# Patient Record
Sex: Female | Born: 1990 | Race: Black or African American | Hispanic: No | Smoking: Never smoker
Health system: Southern US, Community
[De-identification: ages and names within clinical notes are randomized; demographics above are authoritative.]

## PROBLEM LIST (undated history)

## (undated) ENCOUNTER — Inpatient Hospital Stay (HOSPITAL_COMMUNITY): Payer: Medicaid Other

## (undated) DIAGNOSIS — F419 Anxiety disorder, unspecified: Secondary | ICD-10-CM

## (undated) DIAGNOSIS — F32A Depression, unspecified: Secondary | ICD-10-CM

## (undated) HISTORY — DX: Depression, unspecified: F32.A

## (undated) HISTORY — DX: Anxiety disorder, unspecified: F41.9

---

## 2020-05-28 ENCOUNTER — Encounter (HOSPITAL_COMMUNITY): Payer: Self-pay | Admitting: Obstetrics and Gynecology

## 2020-05-28 ENCOUNTER — Inpatient Hospital Stay (HOSPITAL_COMMUNITY): Payer: Medicaid Other

## 2020-05-28 ENCOUNTER — Inpatient Hospital Stay (HOSPITAL_COMMUNITY)
Admission: AD | Admit: 2020-05-28 | Discharge: 2020-05-28 | Disposition: A | Discharge status: Home / Self Care | Payer: Medicaid Other | Attending: Obstetrics and Gynecology | Admitting: Obstetrics and Gynecology

## 2020-05-28 DIAGNOSIS — Z791 Long term (current) use of non-steroidal anti-inflammatories (NSAID): Secondary | ICD-10-CM | POA: Insufficient documentation

## 2020-05-28 DIAGNOSIS — O26891 Other specified pregnancy related conditions, first trimester: Secondary | ICD-10-CM | POA: Diagnosis not present

## 2020-05-28 DIAGNOSIS — Z3A01 Less than 8 weeks gestation of pregnancy: Secondary | ICD-10-CM | POA: Diagnosis not present

## 2020-05-28 DIAGNOSIS — Z79899 Other long term (current) drug therapy: Secondary | ICD-10-CM | POA: Insufficient documentation

## 2020-05-28 DIAGNOSIS — R109 Unspecified abdominal pain: Secondary | ICD-10-CM | POA: Insufficient documentation

## 2020-05-28 DIAGNOSIS — O26851 Spotting complicating pregnancy, first trimester: Secondary | ICD-10-CM

## 2020-05-28 DIAGNOSIS — O3680X0 Pregnancy with inconclusive fetal viability, not applicable or unspecified: Secondary | ICD-10-CM

## 2020-05-28 DIAGNOSIS — O209 Hemorrhage in early pregnancy, unspecified: Secondary | ICD-10-CM | POA: Insufficient documentation

## 2020-05-28 HISTORY — DX: Anxiety disorder, unspecified: F41.9

## 2020-05-28 HISTORY — DX: Depression, unspecified: F32.A

## 2020-05-28 LAB — COMPREHENSIVE METABOLIC PANEL
ALT: 22 U/L (ref 0–44)
AST: 20 U/L (ref 15–41)
Albumin: 3.6 g/dL (ref 3.5–5.0)
Alkaline Phosphatase: 35 U/L — ABNORMAL LOW (ref 38–126)
Anion gap: 10 (ref 5–15)
BUN: 7 mg/dL (ref 6–20)
CO2: 23 mmol/L (ref 22–32)
Calcium: 9.1 mg/dL (ref 8.9–10.3)
Chloride: 105 mmol/L (ref 98–111)
Creatinine, Ser: 0.65 mg/dL (ref 0.44–1.00)
GFR calc Af Amer: >60 mL/min (ref >60)
GFR calc non Af Amer: >60 mL/min (ref >60)
Glucose, Bld: 99 mg/dL (ref 70–99)
Potassium: 3.7 mmol/L (ref 3.5–5.1)
Sodium: 138 mmol/L (ref 135–145)
Total Bilirubin: 0.7 mg/dL (ref 0.3–1.2)
Total Protein: 7.1 g/dL (ref 6.5–8.1)

## 2020-05-28 LAB — URINALYSIS, ROUTINE W REFLEX MICROSCOPIC
Bacteria, UA: NONE SEEN
Bilirubin Urine: NEGATIVE
Glucose, UA: NEGATIVE mg/dL
Ketones, ur: NEGATIVE mg/dL
Leukocytes,Ua: NEGATIVE
Nitrite: NEGATIVE
Protein, ur: 30 mg/dL — AB
RBC / HPF: >50 RBC/hpf — ABNORMAL HIGH (ref 0–5)
Specific Gravity, Urine: 1.021 (ref 1.005–1.030)
pH: 5 (ref 5.0–8.0)

## 2020-05-28 LAB — CBC
HCT: 40.7 % (ref 36.0–46.0)
Hemoglobin: 12.9 g/dL (ref 12.0–15.0)
MCH: 27.7 pg (ref 26.0–34.0)
MCHC: 31.7 g/dL (ref 30.0–36.0)
MCV: 87.3 fL (ref 80.0–100.0)
Platelets: 452 10*3/uL — ABNORMAL HIGH (ref 150–400)
RBC: 4.66 MIL/uL (ref 3.87–5.11)
RDW: 15.4 % (ref 11.5–15.5)
WBC: 7.2 10*3/uL (ref 4.0–10.5)
nRBC: 0 % (ref 0.0–0.2)

## 2020-05-28 LAB — HCG, QUANTITATIVE, PREGNANCY: hCG, Beta Chain, Quant, S: 423 m[IU]/mL — ABNORMAL HIGH (ref <5)

## 2020-05-28 LAB — TYPE AND SCREEN
ABO/RH(D): O POS
Antibody Screen: NEGATIVE

## 2020-05-28 LAB — ABO/RH: ABO/RH(D): O POS

## 2020-05-28 LAB — WET PREP, GENITAL
Clue Cells Wet Prep HPF POC: NONE SEEN
Sperm: NONE SEEN
Trich, Wet Prep: NONE SEEN
Yeast Wet Prep HPF POC: NONE SEEN

## 2020-05-28 LAB — POCT PREGNANCY, URINE: Preg Test, Ur: POSITIVE — AB

## 2020-05-28 NOTE — MAU Provider Note (Signed)
History     [id number]  Arrival date and time: 05/28/20 5885    Chief Complaint  Patient presents with   Vaginal Bleeding   Abdominal Pain     HPI Mackenzie Sloan is a 29 y.o. at [redacted]w[redacted]d by certain LMP with PMHx notable for SAB (G1), who presents for abdominal cramping and vaginal bleeding. Patient states scant VB started 6 days ago and she was seen by her OBGYN who said her labs were "fine." She is unsure what was collected. No ultrasound performed. Over the past 3-4 days bleeding increased, 3-4 pads daily. This morning she endorses abdominal cramping that woke her from sleep, currently resolved. She denies n/v/d/c. Last intercourse 1 week ago. Not on contraception. No abnormal vaginal discharge or odor. No hematuria, urgency, frequency, dysuria. Tolerating PO without difficulty.    No outside records available for review, patient just moved to [facility], previously seen in [place]  --/--/O POS (07/27 0941)  OB History    Gravida  2   Para      Term      Preterm      AB  1   Living        SAB  1   TAB      Ectopic      Multiple      Live Births              Past Medical History:  Diagnosis Date   Anxiety    Depression     History reviewed. No pertinent surgical history.  History reviewed. No pertinent family history.  Social History   Socioeconomic History   Marital status: Divorced    Spouse name: Not on file   Number of children: Not on file   Years of education: Not on file   Highest education level: Not on file  Occupational History   Not on file  Tobacco Use   Smoking status: Never Smoker   Smokeless tobacco: Never Used  Vaping Use   Vaping Use: Never used  Substance and Sexual Activity   Alcohol use: Not Currently   Drug use: Yes    Types: Marijuana   Sexual activity: Yes  Other Topics Concern   Not on file  Social History Narrative   Not on file   Social Determinants of Health   Financial Resource Strain:     Difficulty of Paying Living Expenses:   Food Insecurity:    Worried About Programme researcher, broadcasting/film/video in the Last Year:    Barista in the Last Year:   Transportation Needs:    Freight forwarder (Medical):    Lack of Transportation (Non-Medical):   Physical Activity:    Days of Exercise per Week:    Minutes of Exercise per Session:   Stress:    Feeling of Stress :   Social Connections:    Frequency of Communication with Friends and Family:    Frequency of Social Gatherings with Friends and Family:    Attends Religious Services:    Active Member of Clubs or Organizations:    Attends Engineer, structural:    Marital Status:   Intimate Partner Violence:    Fear of Current or Ex-Partner:    Emotionally Abused:    Physically Abused:    Sexually Abused:     No Known Allergies  No current facility-administered medications on file prior to encounter.   Current Outpatient Medications on File Prior to Encounter  Medication Sig  Dispense Refill   ibuprofen (ADVIL) 800 MG tablet Take 800 mg by mouth every 8 (eight) hours as needed.     Prenatal Vit-Fe Fumarate-FA (PRENATAL MULTIVITAMIN) TABS tablet Take 1 tablet by mouth daily at 12 noon.       ROS Pertinent positives and negative per HPI, all others reviewed and negative  Physical Exam   BP 118/78 (BP Location: Right Arm)    Pulse 103    Temp 98.8 F (37.1 C) (Oral)    Resp 16    Ht 5\' 4"  (1.626 m)    Wt (!) 101.2 kg    LMP 04/12/2020    SpO2 100%    BMI 38.31 kg/m   Physical Exam Vitals and nursing note reviewed. Exam conducted with a chaperone present.  Constitutional:      General: She is not in acute distress.    Appearance: Normal appearance. She is normal weight.  HENT:     Head: Normocephalic and atraumatic.     Nose: Nose normal.     Mouth/Throat:     Mouth: Mucous membranes are moist.     Pharynx: Oropharynx is clear.  Eyes:     Extraocular Movements: Extraocular movements  intact.     Conjunctiva/sclera: Conjunctivae normal.  Cardiovascular:     Rate and Rhythm: Normal rate.     Pulses: Normal pulses.  Pulmonary:     Effort: Pulmonary effort is normal.  Abdominal:     General: Abdomen is flat.     Palpations: Abdomen is soft.     Tenderness: There is no abdominal tenderness. There is no guarding or rebound.  Genitourinary:    Vagina: Normal.     Cervix: No discharge or friability.     Comments: Scant bright red blood at cervical os, some blood pooling in the posterior fornix. Closed cervical os. Musculoskeletal:        General: Normal range of motion.     Cervical back: Normal range of motion and neck supple.  Skin:    General: Skin is warm and dry.  Neurological:     General: No focal deficit present.     Mental Status: She is alert and oriented to person, place, and time. Mental status is at baseline.  Psychiatric:        Mood and Affect: Mood normal.        Behavior: Behavior normal.     Cervical Exam  As noted above  Bedside Ultrasound Formal ultrasound ordered  My interpretation: n/a  FHT Not completed given GA  Labs Results for orders placed or performed during the hospital encounter of 05/28/20 (from the past 24 hour(s))  Urinalysis, Routine w reflex microscopic     Status: Abnormal   Collection Time: 05/28/20  9:09 AM  Result Value Ref Range   Color, Urine YELLOW YELLOW   APPearance HAZY (A) CLEAR   Specific Gravity, Urine 1.021 1.005 - 1.030   pH 5.0 5.0 - 8.0   Glucose, UA NEGATIVE NEGATIVE mg/dL   Hgb urine dipstick LARGE (A) NEGATIVE   Bilirubin Urine NEGATIVE NEGATIVE   Ketones, ur NEGATIVE NEGATIVE mg/dL   Protein, ur 30 (A) NEGATIVE mg/dL   Nitrite NEGATIVE NEGATIVE   Leukocytes,Ua NEGATIVE NEGATIVE   RBC / HPF >50 (H) 0 - 5 RBC/hpf   WBC, UA 0-5 0 - 5 WBC/hpf   Bacteria, UA NONE SEEN NONE SEEN   Squamous Epithelial / LPF 0-5 0 - 5   Mucus PRESENT   Pregnancy, urine  POC     Status: Abnormal   Collection Time:  05/28/20  9:09 AM  Result Value Ref Range   Preg Test, Ur POSITIVE (A) NEGATIVE  Wet prep, genital     Status: Abnormal   Collection Time: 05/28/20  9:32 AM   Specimen: Vaginal  Result Value Ref Range   Yeast Wet Prep HPF POC NONE SEEN NONE SEEN   Trich, Wet Prep NONE SEEN NONE SEEN   Clue Cells Wet Prep HPF POC NONE SEEN NONE SEEN   WBC, Wet Prep HPF POC MODERATE (A) NONE SEEN   Sperm NONE SEEN   CBC     Status: Abnormal   Collection Time: 05/28/20  9:41 AM  Result Value Ref Range   WBC 7.2 4.0 - 10.5 K/uL   RBC 4.66 3.87 - 5.11 MIL/uL   Hemoglobin 12.9 12.0 - 15.0 g/dL   HCT 32.9 36 - 46 %   MCV 87.3 80.0 - 100.0 fL   MCH 27.7 26.0 - 34.0 pg   MCHC 31.7 30.0 - 36.0 g/dL   RDW 92.4 26.8 - 34.1 %   Platelets 452 (H) 150 - 400 K/uL   nRBC 0.0 0.0 - 0.2 %  Type and screen [facility]     Status: None   Collection Time: 05/28/20  9:41 AM  Result Value Ref Range   ABO/RH(D) O POS    Antibody Screen NEG    Sample Expiration      05/31/2020,2359 Performed at [facility], [address], [place], [place] [address]   Comprehensive metabolic panel     Status: Abnormal   Collection Time: 05/28/20  9:41 AM  Result Value Ref Range   Sodium 138 135 - 145 mmol/L   Potassium 3.7 3.5 - 5.1 mmol/L   Chloride 105 98 - 111 mmol/L   CO2 23 22 - 32 mmol/L   Glucose, Bld 99 70 - 99 mg/dL   BUN 7 6 - 20 mg/dL   Creatinine, Ser 9.79 0.44 - 1.00 mg/dL   Calcium 9.1 8.9 - 89.2 mg/dL   Total Protein 7.1 6.5 - 8.1 g/dL   Albumin 3.6 3.5 - 5.0 g/dL   AST 20 15 - 41 U/L   ALT 22 0 - 44 U/L   Alkaline Phosphatase 35 (L) 38 - 126 U/L   Total Bilirubin 0.7 0.3 - 1.2 mg/dL   GFR calc non Af Amer >60 >60 mL/min   GFR calc Af Amer >60 >60 mL/min   Anion gap 10 5 - 15  hCG, quantitative, pregnancy     Status: Abnormal   Collection Time: 05/28/20  9:41 AM  Result Value Ref Range   hCG, Beta Chain, Quant, S 423 (H) <5 mIU/mL    Imaging US OB LESS THAN 14 WEEKS WITH OB  TRANSVAGINAL  Result Date: 05/28/2020 CLINICAL DATA:  Pregnancy of unknown location.  Abdominal pain EXAM: OBSTETRIC <14 WK [place] AND TRANSVAGINAL OB TECHNIQUE: Both transabdominal and transvaginal ultrasound examinations were performed for complete evaluation of the gestation as well as the maternal uterus, adnexal regions, and pelvic cul-de-sac. Transvaginal technique was performed to assess early pregnancy. COMPARISON:  None. FINDINGS: No evidence of intra or extra uterine gestational sac. Small nabothian cysts are present. No adnexal mass or pelvic fluid. Symmetric ovarian size. Negative uterus. Pulsed Doppler evaluation of both ovaries demonstrates normal appearing low-resistance arterial and venous waveforms. IMPRESSION: Pregnancy of unknown location with normal pelvic ultrasound. Differential considerations include intrauterine gestation too  early to be sonographically visualized, spontaneous abortion, or ectopic pregnancy. Consider follow-up ultrasound in 10 days and serial quantitative beta HCG follow-up. Electronically Signed   By: Marnee Spring M.D.   On: 05/28/2020 10:40    MAU Course  Procedures  Lab Orders     Wet prep, genital     Urinalysis, Routine w reflex microscopic     CBC     Comprehensive metabolic panel     hCG, quantitative, pregnancy     Pregnancy, urine POC No orders of the defined types were placed in this encounter.   Imaging Orders     US OB LESS THAN 14 WEEKS WITH OB TRANSVAGINAL  MDM moderate  Assessment and Plan  29 yo G2P0010 at [redacted]w[redacted]d by LMP who presents for abdominal cramping and VB.   #Pregnancy of unknown location: Patient presents after positive pregnancy test and 6 days of vaginal bleeding. Cramping started this morning. BHCG 423, other lab work unremarkable. GCC pending. TVUS demonstrated pregnancy of unknown location. Counseling provided to patient regarding outcome of this pregnancy, patient voiced understanding. -follow up [facility] 130pm on 7/29 for  repeat hcg, appt scheduled -strict return precautions provided -continue PNV, no ibuprofen   Rozann Lesches, MD OB Fellow, Faculty Practice [facility], Center for [facility] 05/28/2020 11:50 AM

## 2020-05-28 NOTE — MAU Note (Addendum)
Has been seen at [facility] dr in [place].(confirmed, no [place]) Just moved here.  Started bleeding 4 days ago, heavy with clots at times.  Cramping in lower abd and back.

## 2020-05-28 NOTE — Discharge Instructions (Signed)
[facility] appt 05/30/20 at 130pm   Abdominal Pain During Pregnancy  Belly (abdominal) pain is common during pregnancy. There are many possible causes. Most of the time, it is not a serious problem. Other times, it can be a sign that something is wrong with the pregnancy. Always tell your doctor if you have belly pain. Follow these instructions at home:  Do not have sex or put anything in your vagina until your pain goes away completely.  Get plenty of rest until your pain gets better.  Drink enough fluid to keep your pee (urine) pale yellow.  Take over-the-counter and prescription medicines only as told by your doctor.  Keep all follow-up visits as told by your doctor. This is important. Contact a doctor if:  Your pain continues or gets worse after resting.  You have lower belly pain that: ? Comes and goes at regular times. ? Spreads to your back. ? Feels like menstrual cramps.  You have pain or burning when you pee (urinate). Get help right away if:  You have a fever or chills.  You have vaginal bleeding.  You are leaking fluid from your vagina.  You are passing tissue from your vagina.  You throw up (vomit) for more than 24 hours.  You have watery poop (diarrhea) for more than 24 hours.  Your baby is moving less than usual.  You feel very weak or faint.  You have shortness of breath.  You have very bad pain in your upper belly. Summary  Belly (abdominal) pain is common during pregnancy. There are many possible causes.  If you have belly pain during pregnancy, tell your doctor right away.  Keep all follow-up visits as told by your doctor. This is important. This information is not intended to replace advice given to you by your health care provider. Make sure you discuss any questions you have with your health care provider. Document Revised: 02/06/2019 Document Reviewed: 01/21/2017 Elsevier Patient Education  2020 [employer].

## 2020-05-29 LAB — GC/CHLAMYDIA PROBE AMP (~~LOC~~) NOT AT ARMC
Chlamydia: NEGATIVE
Comment: NEGATIVE
Comment: NORMAL
Neisseria Gonorrhea: NEGATIVE

## 2020-05-30 ENCOUNTER — Other Ambulatory Visit: Payer: Self-pay

## 2020-05-30 ENCOUNTER — Ambulatory Visit (INDEPENDENT_AMBULATORY_CARE_PROVIDER_SITE_OTHER): Payer: Self-pay | Admitting: Lactation Services

## 2020-05-30 ENCOUNTER — Encounter: Payer: Self-pay | Admitting: Lactation Services

## 2020-05-30 VITALS — BP 112/58 | HR 106 | Ht 64.0 in | Wt 221.4 lb

## 2020-05-30 DIAGNOSIS — O3680X0 Pregnancy with inconclusive fetal viability, not applicable or unspecified: Secondary | ICD-10-CM

## 2020-05-30 LAB — BETA HCG QUANT (REF LAB): hCG Quant: 253 m[IU]/mL

## 2020-05-30 NOTE — Progress Notes (Signed)
Patient here for follow up HCG for Pregnancy of Unknown location.   Patient reports bleeding is slowing down. She is changing her pad twice a day now and not bleeding as much as she was and no blood when wiping. Patient reports she feels like she passed some tissue and is aware that the pregnancy most like has failed.   She reports pain to mid lower abdomen, to back and along her right side. Pain to right side is new and 8/10 this morning, 7/10 now. Patient reports she was informed not to take Ibuprofen. Reviewed she can take Tylenol 650 mg every 4 hours or 1000 mg every 6 hours.   Reviewed case with Dr. Debroah Loop with recommendations of trying Tylenol, going to MAU with worsening symptoms and follow up with patient after Hcg back.   Reviewed ectopic precautions with patient and when to seek care at MAU.   Patient informed that she will be called with results in a few hours with recommendation for follow up. Patient voiced understanding. Patient to call with questions/concerns as needed.   4:50 pm- Reviewed Hcg levels with Dr. Cindi Carbon patient with results of beta hcg. Informed patient that pregnancy levels are dropping indicating a failed pregnancy. Follow up Beta Hcg scheduled for 8/6 at 08:30. Patient notified.

## 2020-06-03 NOTE — Progress Notes (Signed)
Patient ID: Mackenzie Sloan, female   DOB: 05-19-1991, 29 y.o.   MRN: [id number] Patient was assessed and managed by nursing staff during this encounter. I have reviewed the chart and agree with the documentation and plan. I have also made any necessary editorial changes.  Scheryl Darter, MD 06/03/2020 3:19 PM

## 2020-06-07 ENCOUNTER — Telehealth: Payer: Self-pay | Admitting: *Deleted

## 2020-06-07 ENCOUNTER — Ambulatory Visit: Payer: Self-pay

## 2020-06-07 NOTE — Telephone Encounter (Signed)
Pt left VM message stating that she would not be able to make her appt scheduled @ 0930 and would like to reschedule. I returned call to pt and offered appt on 8/9 @ 1040 which she accepted. Pt stated that she is feeling much better. Her bleeding has stopped and she is having only mild intermittent cramping.

## 2020-06-10 ENCOUNTER — Ambulatory Visit: Payer: Medicaid Other | Admitting: Obstetrics & Gynecology

## 2020-06-10 ENCOUNTER — Other Ambulatory Visit: Payer: Self-pay

## 2020-06-10 DIAGNOSIS — O039 Complete or unspecified spontaneous abortion without complication: Secondary | ICD-10-CM

## 2020-06-11 LAB — BETA HCG QUANT (REF LAB): hCG Quant: 178 m[IU]/mL

## 2020-06-13 NOTE — Progress Notes (Signed)
HCG was decreased but should be repeated next week and until negative

## 2020-06-14 ENCOUNTER — Telehealth: Payer: Self-pay | Admitting: *Deleted

## 2020-06-14 DIAGNOSIS — O039 Complete or unspecified spontaneous abortion without complication: Secondary | ICD-10-CM

## 2020-06-14 NOTE — Telephone Encounter (Addendum)
-----   Message from Adam Phenix, MD sent at 06/13/2020  3:15 PM EDT ----- HCG was decreased but should be repeated next week and until negative  8/13  0908  Called pt and informed her of lab results as well as recommended plan of care for weekly hormone level until <5. She voiced understanding and stated she can come in on 8/17. She prefers appt @ 10:00-10:30 am. Pt was advised that she will be contacted with the appt.

## 2020-06-18 ENCOUNTER — Other Ambulatory Visit: Payer: Medicaid Other

## 2020-07-02 ENCOUNTER — Other Ambulatory Visit: Payer: Self-pay

## 2020-07-02 ENCOUNTER — Telehealth (INDEPENDENT_AMBULATORY_CARE_PROVIDER_SITE_OTHER): Payer: Medicaid Other | Admitting: *Deleted

## 2020-07-02 ENCOUNTER — Inpatient Hospital Stay (HOSPITAL_COMMUNITY)
Admission: AD | Admit: 2020-07-02 | Discharge: 2020-07-03 | Disposition: A | Discharge status: Home / Self Care | Payer: Medicaid Other | Attending: Family Medicine | Admitting: Family Medicine

## 2020-07-02 DIAGNOSIS — O039 Complete or unspecified spontaneous abortion without complication: Secondary | ICD-10-CM

## 2020-07-02 DIAGNOSIS — R109 Unspecified abdominal pain: Secondary | ICD-10-CM | POA: Insufficient documentation

## 2020-07-02 DIAGNOSIS — Z8759 Personal history of other complications of pregnancy, childbirth and the puerperium: Secondary | ICD-10-CM | POA: Diagnosis not present

## 2020-07-02 DIAGNOSIS — R1030 Lower abdominal pain, unspecified: Secondary | ICD-10-CM | POA: Diagnosis not present

## 2020-07-02 NOTE — Telephone Encounter (Signed)
Pt called to our office stating she had been having lab work done and would like to discuss. Pt states she may also be pregnancy again. Pt would like to talk with someone with questions.  Pt has been seen at [facility] - call to be routed to that office.

## 2020-07-02 NOTE — MAU Provider Note (Signed)
First Provider Initiated Contact with Patient 07/02/20 2317      [name] is a 29 y.o. G2P0010 female who presents to MAU today with complaint of abdominal pain. Patient was initially seen at the end of July and diagnosed with a miscarriage. Patient reports that she was having a heavy period like bleeding for 1.5 weeks after being diagnosed with miscarriage. Stopped bleeding for 1 week mid August, then started back having bleeding. She reports now having dark brown spotting. She denies having any medication to help miscarriage or pain medication.   Patient reports that she has been having abdominal pain and back pain on/off since the miscarriage but it has increased over the past 3 days. Describes the pain as sharp shooting pain in pelvis that radiates around to back. Patient reports having a mild panic attack this evening around dinner time, patient is tearful and anxious in room. Patient denies SOB upon arrival to MAU.   Patient is being seen in the office for repeat HCG levels, patient was last seen on 06/10/20- HCG was 178 at that time. Patient missed appointment on 06/14/20. Patient reports taking HPT this week and it is still positive.   O BP 119/82 (BP Location: Right Arm)   Pulse (!) 105   Temp 98.9 F (37.2 C) (Oral)   Resp 20   Ht 5\' 4"  (1.626 m)   Wt 102.3 kg   LMP 04/12/2020   SpO2 99%   BMI 38.72 kg/m  Physical Exam Vitals reviewed.  Constitutional:      Comments: Tearful and anxious   HENT:     Head: Normocephalic.  Cardiovascular:     Rate and Rhythm: Normal rate and regular rhythm.  Pulmonary:     Effort: Pulmonary effort is normal. No respiratory distress.     Breath sounds: Normal breath sounds. No wheezing.  Abdominal:     General: There is no distension.     Palpations: Abdomen is soft. There is no mass.     Tenderness: There is no abdominal tenderness. There is no guarding.  Skin:    General: Skin is warm and dry.  Neurological:     Mental  Status: She is alert and oriented to person, place, and time.  Psychiatric:        Mood and Affect: Mood normal.        Behavior: Behavior normal.        Thought Content: Thought content normal.    Repeat HCG today 30 , has been treading down slowly. HCG was 128 on 8/9. Educated and discussed with patient option of continued waiting and repeat HCG in 1 week to assess for completion of pregnancy or cytotec at home to clean out rest of products. Patient reports that she wants to do cytotec. Educated and discussed side effects and what to expect with cytotec. Follow up in 1 week for repeat HCG following cytotec.   Discussed reasons to return to MAU. Follow up as scheduled in the office. Return to MAU as needed. Pt stable at time of discharge.    A 1. SAB (spontaneous abortion)   2. Lower abdominal pain     P Discussed reasons to return to MAU.  Follow up as scheduled in the office.  Return to MAU as needed.  Pt stable at time of discharge.    10/9, CNM 07/03/2020 1:45 AM

## 2020-07-02 NOTE — MAU Note (Addendum)
Pt here with mid abdominal pain and striking pain in back that started 3 days ago. Pt reports it feels bloated. Reports she had a miscarriage on July 23rd. Has been having hcg levels followed. Has appointment tomorrow for hcg draw. She denies vaginal bleeding. Has some brown discharge when wiping at some times. Also reports SOB and feeling like her heart was racing this afternoon, none currently. She denies cough or fever. No history of asthma. Has history of panic attacks, sob felt like panic attack. Pt is emotionally distressed in triage and tearful. Reports she also thinks she may have uti as she has an uncomfortable feeling when she tries to urinate.

## 2020-07-02 NOTE — Telephone Encounter (Signed)
Returned patients call. Patient reports she took a pregnancy test and it shows she is still pregnant.   Reviewed with patient that pregnancy test will test as Positive as long as levels are above about 5 and that the positive pregnancy test is a results of the pregnancy she just lost and not a new pregnancy.   Scheduled patient to come in tomorrow for follow up Hcg as she missed her last appt. Patient voiced understanding. Message to front office to schedule patient for tomorrow.

## 2020-07-03 ENCOUNTER — Other Ambulatory Visit: Payer: Medicaid Other

## 2020-07-03 LAB — URINALYSIS, ROUTINE W REFLEX MICROSCOPIC
Bacteria, UA: NONE SEEN
Bilirubin Urine: NEGATIVE
Glucose, UA: NEGATIVE mg/dL
Ketones, ur: NEGATIVE mg/dL
Leukocytes,Ua: NEGATIVE
Nitrite: NEGATIVE
Protein, ur: NEGATIVE mg/dL
Specific Gravity, Urine: 1.025 (ref 1.005–1.030)
pH: 7 (ref 5.0–8.0)

## 2020-07-03 LAB — CBC
HCT: 37.2 % (ref 36.0–46.0)
Hemoglobin: 11.7 g/dL — ABNORMAL LOW (ref 12.0–15.0)
MCH: 27.5 pg (ref 26.0–34.0)
MCHC: 31.5 g/dL (ref 30.0–36.0)
MCV: 87.5 fL (ref 80.0–100.0)
Platelets: 437 10*3/uL — ABNORMAL HIGH (ref 150–400)
RBC: 4.25 MIL/uL (ref 3.87–5.11)
RDW: 15.3 % (ref 11.5–15.5)
WBC: 7.1 10*3/uL (ref 4.0–10.5)
nRBC: 0 % (ref 0.0–0.2)

## 2020-07-03 LAB — HCG, QUANTITATIVE, PREGNANCY: hCG, Beta Chain, Quant, S: 30 m[IU]/mL — ABNORMAL HIGH (ref <5)

## 2020-07-03 MED ORDER — OXYCODONE-ACETAMINOPHEN 5-325 MG PO TABS: 1.0000 | ORAL_TABLET | Freq: Four times a day (QID) | ORAL | 0 refills | Status: AC | PRN

## 2020-07-03 MED ORDER — MISOPROSTOL 200 MCG PO TABS: ORAL_TABLET | ORAL | 0 refills | Status: DC

## 2020-07-11 ENCOUNTER — Other Ambulatory Visit: Payer: Self-pay

## 2020-07-11 ENCOUNTER — Other Ambulatory Visit: Payer: Medicaid Other

## 2020-07-11 DIAGNOSIS — O039 Complete or unspecified spontaneous abortion without complication: Secondary | ICD-10-CM

## 2020-07-11 NOTE — Progress Notes (Signed)
bet

## 2020-07-17 ENCOUNTER — Ambulatory Visit (INDEPENDENT_AMBULATORY_CARE_PROVIDER_SITE_OTHER): Payer: Medicaid Other

## 2020-07-17 ENCOUNTER — Other Ambulatory Visit: Payer: Self-pay

## 2020-07-17 ENCOUNTER — Other Ambulatory Visit: Payer: Medicaid Other

## 2020-07-17 DIAGNOSIS — O021 Missed abortion: Secondary | ICD-10-CM

## 2020-07-17 DIAGNOSIS — R1030 Lower abdominal pain, unspecified: Secondary | ICD-10-CM | POA: Diagnosis not present

## 2020-07-17 DIAGNOSIS — R309 Painful micturition, unspecified: Secondary | ICD-10-CM

## 2020-07-17 DIAGNOSIS — R319 Hematuria, unspecified: Secondary | ICD-10-CM

## 2020-07-17 DIAGNOSIS — O039 Complete or unspecified spontaneous abortion without complication: Secondary | ICD-10-CM

## 2020-07-17 LAB — POCT URINALYSIS DIP (DEVICE)
Bilirubin Urine: NEGATIVE
Glucose, UA: NEGATIVE mg/dL
Ketones, ur: NEGATIVE mg/dL
Leukocytes,Ua: NEGATIVE
Nitrite: NEGATIVE
Protein, ur: NEGATIVE mg/dL
Specific Gravity, Urine: 1.025 (ref 1.005–1.030)
Urobilinogen, UA: 0.2 mg/dL (ref 0.0–1.0)
pH: 7 (ref 5.0–8.0)

## 2020-07-17 NOTE — Progress Notes (Signed)
Patient was assessed and managed by nursing staff during this encounter. I have reviewed the chart and agree with the documentation and plan. I have also made any necessary editorial changes.  Alizon Schmeling A Lynda Wanninger, MD 07/17/2020 4:54 PM   

## 2020-07-17 NOTE — Progress Notes (Signed)
Pt here today for non stat beta HCG follow up for failed pregnancy; reports some concerns to lab, report given to clinical staff. Pt reports intermittent lower abdominal pain. Denies any vaginal bleeding or constant pain. Reports some spotting following recent cytotec administration. Pt concerned about complications related to SAB; explained that given her [place] results and decreasing hcg levels, pregnancy appears to be resolving. Pt feels that she has a UTI and is feeling her bladder spasm; prior UA showed some hematuria, no urine culture performed. Explained to pt we can repeat UA today and send for culture if suspicious for UTI. Pt agreeable. Explained we will notify her of any abnormal results and treat accordingly. SAB follow-up appt scheduled for 07/31/20.  Fleet Contras RN 07/17/20

## 2020-07-18 LAB — BETA HCG QUANT (REF LAB): hCG Quant: 7 m[IU]/mL

## 2020-07-25 LAB — URINE CULTURE

## 2020-07-31 ENCOUNTER — Ambulatory Visit: Payer: Medicaid Other | Admitting: Advanced Practice Midwife

## 2020-08-07 ENCOUNTER — Ambulatory Visit (INDEPENDENT_AMBULATORY_CARE_PROVIDER_SITE_OTHER): Payer: Medicaid Other | Admitting: Obstetrics and Gynecology

## 2020-08-07 ENCOUNTER — Encounter: Payer: Self-pay | Admitting: Obstetrics and Gynecology

## 2020-08-07 ENCOUNTER — Ambulatory Visit: Payer: Medicaid Other | Admitting: Clinical

## 2020-08-07 ENCOUNTER — Other Ambulatory Visit: Payer: Self-pay

## 2020-08-07 VITALS — BP 115/81 | HR 97 | Wt 224.0 lb

## 2020-08-07 DIAGNOSIS — F32A Depression, unspecified: Secondary | ICD-10-CM | POA: Diagnosis not present

## 2020-08-07 DIAGNOSIS — F4321 Adjustment disorder with depressed mood: Secondary | ICD-10-CM

## 2020-08-07 DIAGNOSIS — O039 Complete or unspecified spontaneous abortion without complication: Secondary | ICD-10-CM | POA: Diagnosis not present

## 2020-08-07 MED ORDER — SERTRALINE HCL 25 MG PO TABS: 25.0000 mg | ORAL_TABLET | Freq: Every day | ORAL | 2 refills | Status: AC

## 2020-08-07 MED ORDER — HYDROXYZINE HCL 25 MG PO TABS: 25.0000 mg | ORAL_TABLET | Freq: Four times a day (QID) | ORAL | 2 refills | Status: AC | PRN

## 2020-08-07 NOTE — Patient Instructions (Signed)
Center for [facility] Healthcare at [facility] [address] [place], [place] [address] [phone] (main office) [phone] (Pierina Schuknecht's office)  Hello Cora, These are resources that have been helpful to women and families experiencing grief:   [facility] virtual bereavement support group will be facilitated by pastoral care and perinatal education.  To start, this group will meet once a month. The registration location for this support group is on [facility]'s website > your wellbeing > classes and support groups > support groups  [facility] (Individual and group grief support)   [facility]  (803) 084-2567   Also:  www.[email or web address].[email or web address]  www.[facility]hare.org  www.missfoundation.org  www.nilmdts.org        www.stillstandingmag.com  www.rtzhope.org

## 2020-08-07 NOTE — BH Specialist Note (Addendum)
Less than 5 minutes brief introduction today. Pt requesting medication for depression/anxiety after miscarriage, and would like to schedule for virtual visit with [facility] upon checkout. Pt is given additional grief resources on medical visit AVS today.   Depression screen [facility] 2/9 08/07/2020 05/30/2020  Decreased Interest 1 1  Down, Depressed, Hopeless 2 1  PHQ - 2 Score 3 2  Altered sleeping 0 0  Tired, decreased energy 2 1  Change in appetite 0 1  Feeling bad or failure about yourself  1 0  Trouble concentrating 0 0  Moving slowly or fidgety/restless 0 0  Suicidal thoughts 0 0  PHQ-9 Score 6 4  Difficult doing work/chores - Not difficult at all   GAD 7 : Generalized Anxiety Score 08/07/2020 05/30/2020  Nervous, Anxious, on Edge 2 1  Control/stop worrying 2 1  Worry too much - different things 2 1  Trouble relaxing 1 0  Restless 0 0  Easily annoyed or irritable 0 1  Afraid - awful might happen 1 0  Total GAD 7 Score 8 4

## 2020-08-07 NOTE — Progress Notes (Signed)
  GYNECOLOGY PROGRESS NOTE  History:  [name] is a 29 y.o. G2P0010 presents to [facility] office today for follow-up SAB visit. She reports feeling very sad and depressed. She had an appointment with Hulda Marin earlier today and feels better about the plan they established. She has an appointment with a therapist this evening. She reports that her partner is also in therapy and is very supportive of her during this time.  She expresses that they would like to TTC, but "not right now." She feels she needs to get herself together mentally. They plan to use condoms for contraception until they are ready to TTC again. She states that she does not want any hormonal contraception, because she "didn't like the way she felt on BC; increased her anxiety." She denies h/a, dizziness, shortness of breath, n/v, or fever/chills.    The following portions of the patient's history were reviewed and updated as appropriate: allergies, current medications, past family history, past medical history, past social history, past surgical history and problem list.  Review of Systems:  Pertinent items are noted in HPI.   Objective:  Physical Exam Blood pressure 115/81, pulse 97, weight 224 lb (101.6 kg), last menstrual period 04/12/2020, unknown if currently breastfeeding. VS reviewed, nursing note reviewed,  Constitutional: well developed, well nourished, no distress HEENT: normocephalic CV: normal rate Pulm/chest wall: normal effort Breast Exam: deferred Abdomen: soft Neuro: alert and oriented x 3 Skin: warm, dry Psych: affect normal Pelvic exam: deferred  Assessment & Plan:  1. SAB (spontaneous abortion) - Reminded that condoms are 85% effective when used correctly 100% or every SI encounter  2. Depression, unspecified depression type - Ambulatory referral to Integrated Behavioral Health -- see documentation from visit with [facility] today - Advised to continue therapy  long-term, especially when TTC again and with (+) UPT (d/t h/o anxiety and depression) - Rx for sertraline (ZOLOFT) 25 MG tablet; Take 1 tablet (25 mg total) by mouth daily.  Dispense: 30 tablet; Refill: 2 - Rx for hydrOXYzine (ATARAX/VISTARIL) 25 MG tablet; Take 1 tablet (25 mg total) by mouth every 6 (six) hours as needed for itching.  Dispense: 30 tablet; Refill: 2   Raelyn Mora, CNM 10:46 AM

## 2020-08-07 NOTE — Progress Notes (Signed)
2nd miscarriage  Feeling sad and depressed

## 2020-08-19 ENCOUNTER — Telehealth: Payer: Self-pay | Admitting: Clinical

## 2020-08-19 ENCOUNTER — Telehealth: Payer: Self-pay | Admitting: *Deleted

## 2020-08-19 NOTE — Telephone Encounter (Signed)
I reviewed Mackenzie Sloan's MyChart message from 08/18/20 ( office closed then) stating she had thoughts of hurting self and called her. She reports she is not having those thoughts today ; just feels sad. I informed her I will have [name] call her asap today and we will see what we can do to help her. I also informed her if thoughts of hurting herself return before [name] calls her back she can go to [facility] ER for help. She voices understanding.  Nyjai Graff,RN

## 2020-08-19 NOTE — Telephone Encounter (Signed)
Pt states that intrusive thoughts and SI three days ago scared her, and stopped taking Zoloft 2mg  because of this; no SI since discontinuing Zoloft. Pt noticed a few panic attacks during pregnancy and after miscarriage; is taking Vistaril as needed to help manage feelings of panic in those moments. Pt states she feels well-supported and loved by family and friends, agrees to follow up visit with [facility] in two weeks, and agrees to walk-in [facility] if thoughts return.

## 2020-08-20 NOTE — BH Specialist Note (Deleted)
Integrated Behavioral Health via Telemedicine Video (Caregility) Visit  08/20/2020 Mackenzie Sloan [id number]  Number of Integrated Behavioral Health visits: *** Session Start time: 1:15***  Session End time: 1:45*** Total time: {IBH Total Time:21014050} minutes  Referring Provider: *** Type of Service: Individual, Family, *** Patient/Family location: Home [facility] Provider location: [facility] [employer] [facility]  All persons participating in visit: Patient *** and [facility] Clydine Parkison ***    I connected with Smith Robert and/or Clemon Chambers [place]'s {family members:20773} by a video enabled telemedicine application (Caregility) and verified that I am speaking with the correct person using two identifiers.   Discussed confidentiality: {YES/NO:21197}  Confirmed demographics & insurance:  {YES/NO:21197}  I discussed that engaging in this virtual visit, they consent to the provision of behavioral healthcare and the services will be billed under their insurance.   Patient and/or legal guardian expressed understanding and consented to virtual visit: {YES/NO:21197}  PRESENTING CONCERNS: Patient and/or family reports the following symptoms/concerns: *** Duration of problem: ***; Severity of problem: {Mild/Moderate/Severe:20260}  STRENGTHS (Protective Factors/Coping Skills): {CHL AMB BH PROTECTIVE FACTORS/[phone]}  ASSESSMENT: Patient currently experiencing ***.    GOALS ADDRESSED: Patient will: 1.  Reduce symptoms of: {IBH Symptoms:21014056}  2.  Increase knowledge and/or ability of: {IBH Patient Tools:21014057}  3.  Demonstrate ability to: {IBH Goals:21014053}   Progress of Goals: {CHL AMB BH PROGRESS TOWARDS DEYCX:[id number]}  INTERVENTIONS: Interventions utilized:  {IBH Interventions:21014054} Standardized Assessments completed & reviewed: {IBH Screening Tools:21014051}   OUTCOME: Patient Response:  ***   PLAN: 1. Follow up with behavioral health clinician on : *** 2. Behavioral recommendations: *** 3. Referral(s): {IBH Referrals:21014055}  I discussed the assessment and treatment plan with the patient and/or parent/guardian. They were provided an opportunity to ask questions and all were answered. They agreed with the plan and demonstrated an understanding of the instructions.   They were advised to call back or seek an in-person evaluation as appropriate.  I discussed that the purpose of this visit is to provide behavioral health care while limiting exposure to the novel coronavirus.  Discussed there is a possibility of technology failure and discussed alternative modes of communication if that failure occurs.  Valetta Close Mitsuo Budnick

## 2020-09-09 NOTE — BH Specialist Note (Signed)
Integrated Behavioral Health via Telemedicine Video (Caregility) Visit  09/09/2020 Mackenzie Sloan [id number]  Number of Integrated Behavioral Health visits: 2 Session Start time: 2:15  Session End time: 2:29 Total time: 14 minutes  Referring Provider: Raelyn Mora, CNM Type of Service: Individual Patient/Family location: Home [facility] Provider location: [facility]  All persons participating in visit: Patient Mackenzie Sloan and [facility] [place]   I connected with Smith Robert by a video enabled telemedicine application (Caregility) and verified that I am speaking with the correct person using two identifiers.   Discussed confidentiality: Yes   Confirmed demographics & insurance:  Yes   I discussed that engaging in this virtual visit, they consent to the provision of behavioral healthcare and the services will be billed under their insurance.   Patient and/or legal guardian expressed understanding and consented to virtual visit: Yes   PRESENTING CONCERNS: Patient and/or family reports the following symptoms/concerns: Pt states she is taking Vistaril as needed only (less over time), will finish Better Help therapy sessions this month, actively working on past trauma issues. Pt feels she is managing well at this time, would like to continue with Vistaril as needed, and does need to establish with PCP.  Duration of problem: Since loss  STRENGTHS (Protective Factors/Coping Skills): Good social support; faith  ASSESSMENT: Patient currently experiencing Grief.    GOALS ADDRESSED: Patient will:  1.  Demonstrate ability to: Increase healthy adjustment to current life circumstances   Progress of Goals: Ongoing  INTERVENTIONS: Interventions utilized:  Supportive Counseling and Link to [employer] Standardized Assessments completed & reviewed: Not Needed   OUTCOME: Patient Response: Pt agrees to  treatment plan   PLAN: 1. Follow up with behavioral health clinician on : As needed 2. Behavioral recommendations:  -Continue taking Vistaril as needed -Continue with plan to establish with PCP (Discuss with PCP taking over medication management with vistaril) -Continue plan to finish Better Help sessions -Continue plan to utilize grief resources, as discussed and as needed 3. Referral(s): Integrated [employer] (In Clinic)  I discussed the assessment and treatment plan with the patient and/or parent/guardian. They were provided an opportunity to ask questions and all were answered. They agreed with the plan and demonstrated an understanding of the instructions.   They were advised to call back or seek an in-person evaluation as appropriate.  I discussed that the purpose of this visit is to provide behavioral health care while limiting exposure to the novel coronavirus.  Discussed there is a possibility of technology failure and discussed alternative modes of communication if that failure occurs.  Valetta Close Randi Poullard

## 2020-09-11 ENCOUNTER — Ambulatory Visit: Payer: Medicaid Other | Admitting: Clinical

## 2020-09-11 DIAGNOSIS — F4321 Adjustment disorder with depressed mood: Secondary | ICD-10-CM

## 2020-09-11 NOTE — Patient Instructions (Signed)
[facility] [address] [place], [place] [address] [phone] (main office) [phone] (Braelynne Garinger's office)  [place] primary care offices accepting new patients:   [facility] [place] [address] [place], [place] [address] [phone]  [facility] [place] [address] [place], [place] [address] [phone]  [facility] [address] [place], [place] [address] [phone]  [facility] [address] [place], [place] [address] [phone]  [facility] [address] ,  [place]  [address] [phone]  

## 2020-11-18 ENCOUNTER — Ambulatory Visit: Payer: Medicaid Other | Admitting: Physician Assistant

## 2020-11-21 ENCOUNTER — Ambulatory Visit: Payer: Medicaid Other | Admitting: Physician Assistant

## 2021-10-06 IMAGING — US US OB < 14 WEEKS - US OB TV
1 series · 15 of 28 positions shown · non-contrast
Comparison: None.

CLINICAL DATA: Pregnancy of unknown location.  Abdominal pain

EXAM:
OBSTETRIC <14 WK US AND TRANSVAGINAL OB
TECHNIQUE: Both transabdominal and transvaginal ultrasound examinations were
performed for complete evaluation of the gestation as well as the
maternal uterus, adnexal regions, and pelvic cul-de-sac.
Transvaginal technique was performed to assess early pregnancy.

[Series 1: us ob < 14 weeks - us ob tv · 60 acquisitions, 15 frames shown]
[im 1/60]
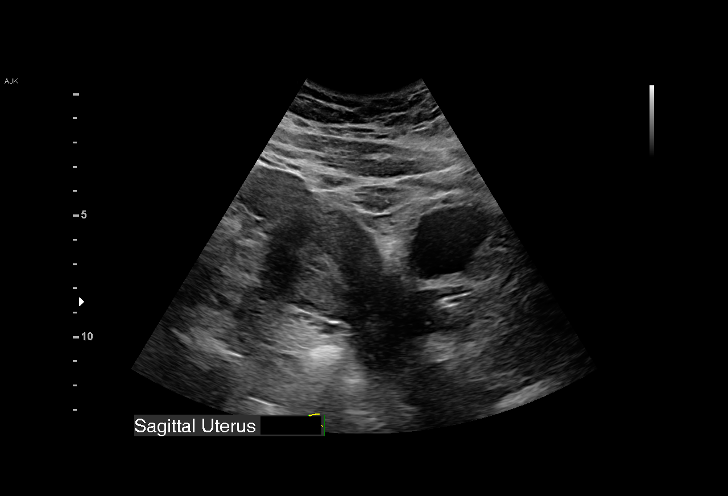
[im 5/60]
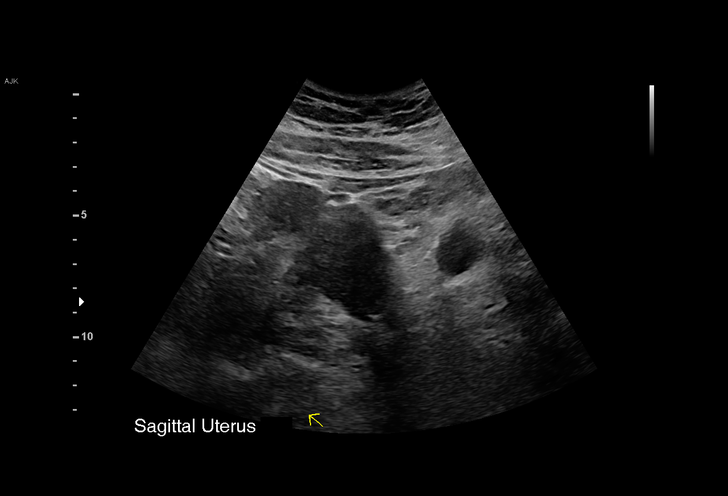
[im 9/60]
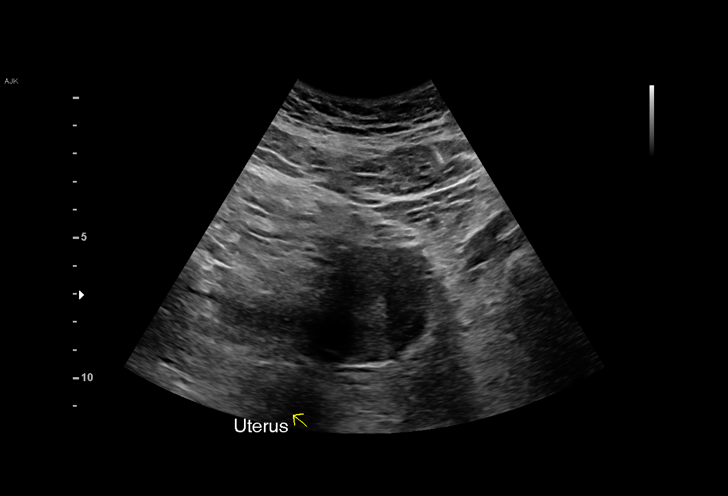
[im 14/60]
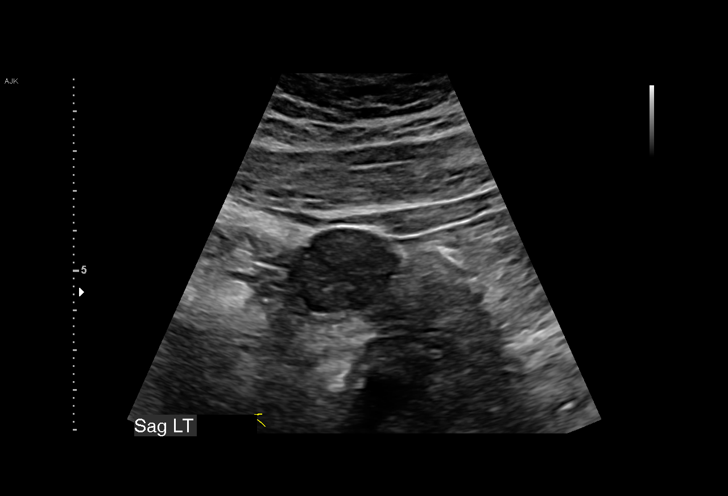
[im 18/60]
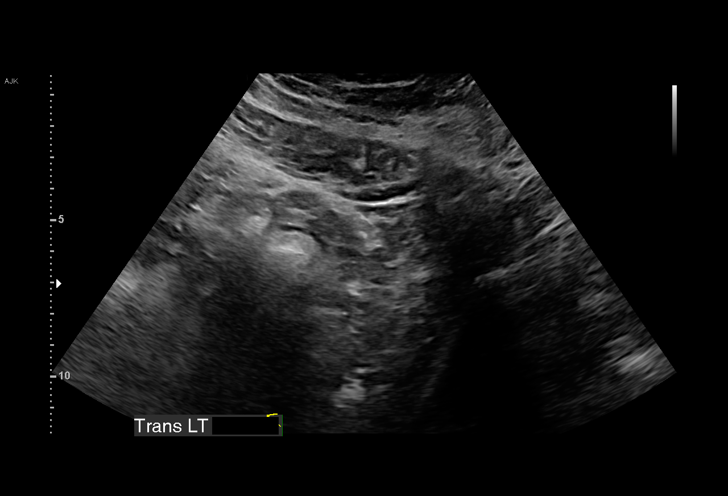
[im 22/60]
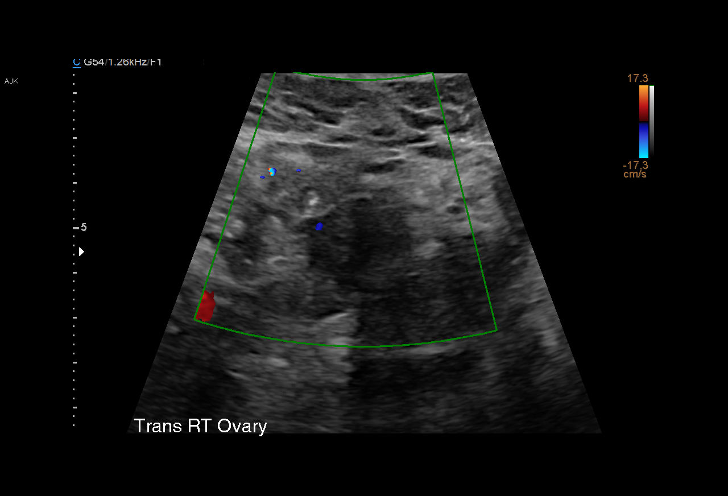
[im 27/60]
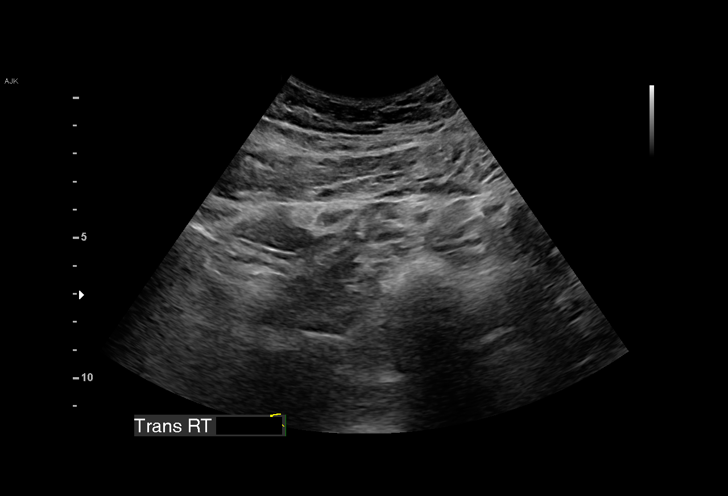
[im 31/60]
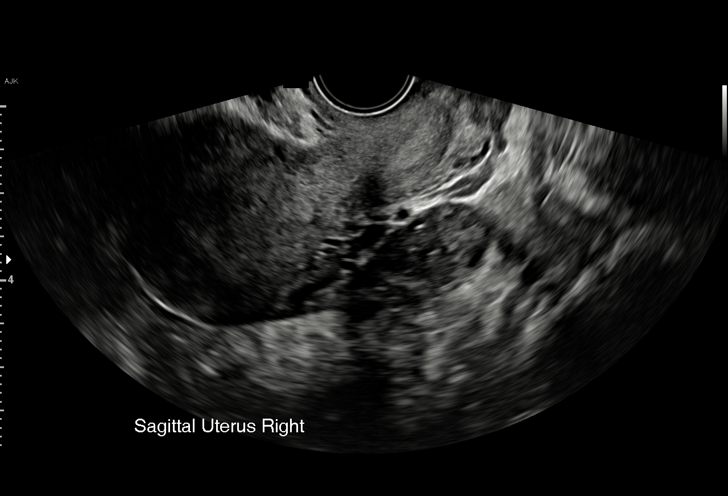
[im 33/60]
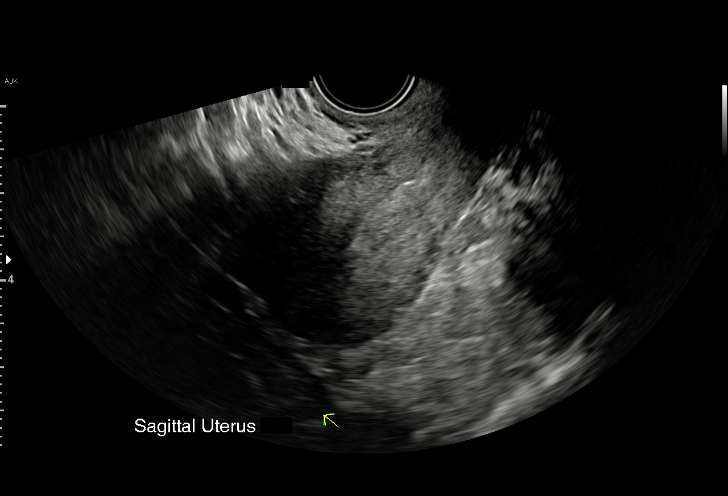
[im 38/60]
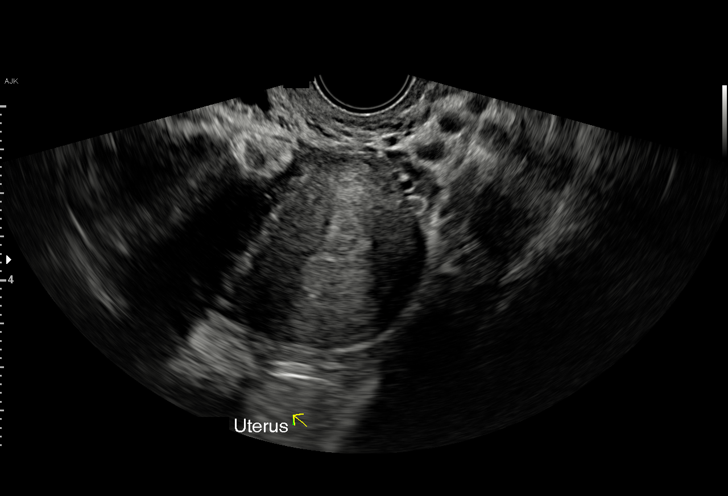
[im 42/60]
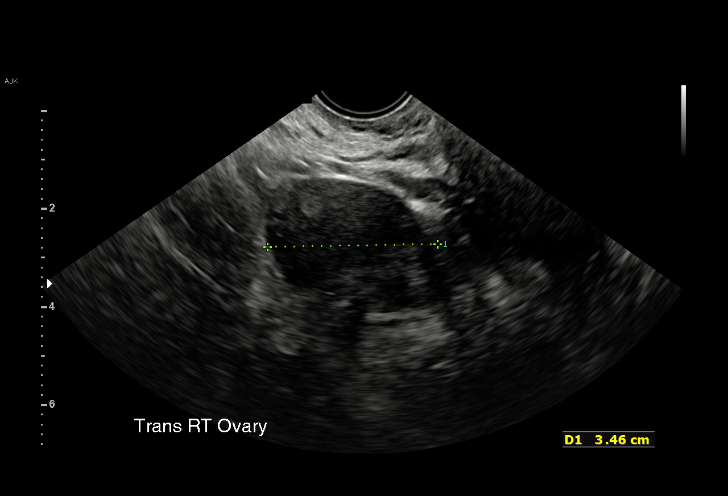
[im 46/60]
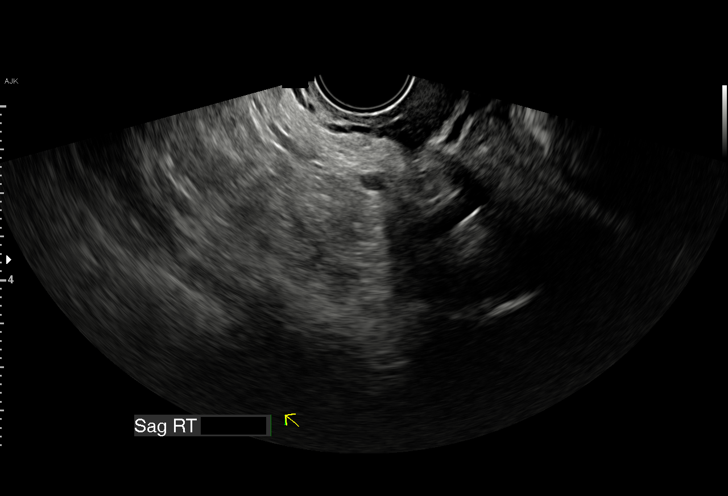
[im 51/60]
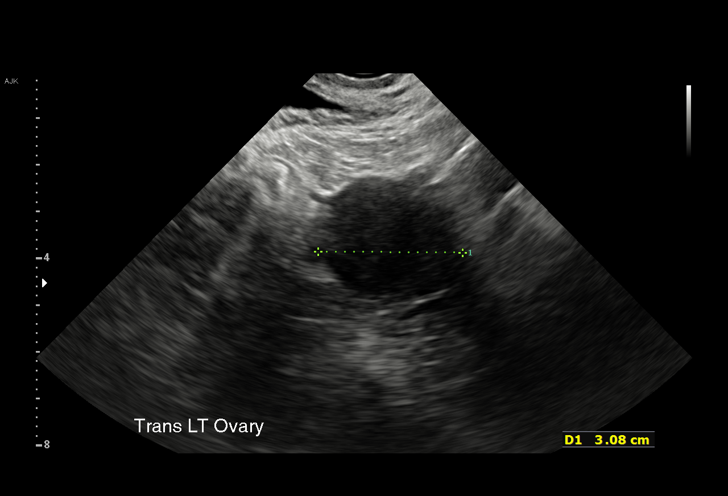
[im 55/60]
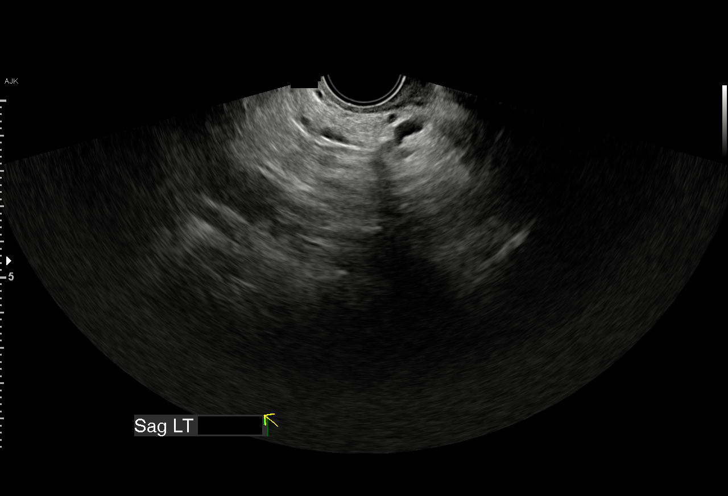
[im 60/60]
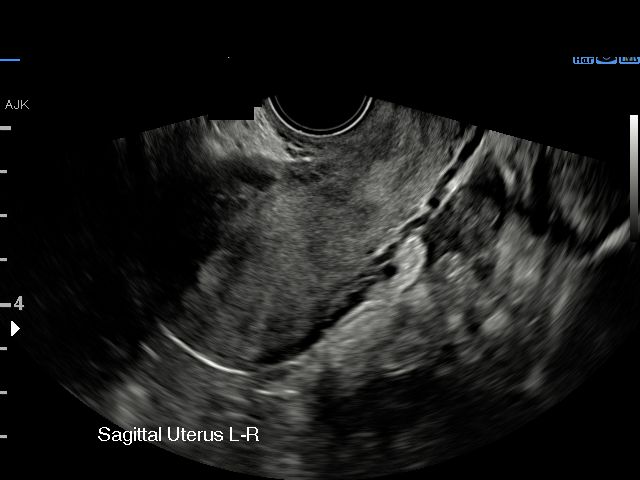

[15 of 28 positions shown; findings below may reference images not displayed]

FINDINGS: No evidence of intra or extra uterine gestational sac. Small
nabothian cysts are present. No adnexal mass or pelvic fluid.
Symmetric ovarian size. Negative uterus.

Pulsed Doppler evaluation of both ovaries demonstrates normal
appearing low-resistance arterial and venous waveforms.
IMPRESSION: Pregnancy of unknown location with normal pelvic ultrasound.
Differential considerations include intrauterine gestation too early
to be sonographically visualized, spontaneous abortion, or ectopic
pregnancy. Consider follow-up ultrasound in 10 days and serial
quantitative beta HCG follow-up.
